# Patient Record
Sex: Male | Born: 1940 | Race: White | Hispanic: No
Health system: Southern US, Community
[De-identification: ages and names within clinical notes are randomized; demographics above are authoritative.]

---

## 2009-01-13 ENCOUNTER — Ambulatory Visit: Payer: Self-pay | Admitting: Specialist

## 2009-01-24 ENCOUNTER — Ambulatory Visit: Payer: Self-pay | Admitting: Radiation Oncology

## 2009-02-07 ENCOUNTER — Ambulatory Visit: Payer: Self-pay | Admitting: Radiation Oncology

## 2009-02-23 ENCOUNTER — Ambulatory Visit: Payer: Self-pay | Admitting: Radiation Oncology

## 2009-03-26 ENCOUNTER — Ambulatory Visit: Payer: Self-pay | Admitting: Radiation Oncology

## 2009-04-26 ENCOUNTER — Ambulatory Visit: Payer: Self-pay | Admitting: Radiation Oncology

## 2009-05-24 ENCOUNTER — Ambulatory Visit: Payer: Self-pay | Admitting: Radiation Oncology

## 2009-06-24 ENCOUNTER — Ambulatory Visit: Payer: Self-pay | Admitting: Radiation Oncology

## 2009-09-23 ENCOUNTER — Ambulatory Visit: Payer: Self-pay | Admitting: Radiation Oncology

## 2009-09-29 ENCOUNTER — Ambulatory Visit: Payer: Self-pay | Admitting: Radiation Oncology

## 2009-09-30 LAB — PSA

## 2009-10-24 ENCOUNTER — Ambulatory Visit: Payer: Self-pay | Admitting: Radiation Oncology

## 2010-04-24 ENCOUNTER — Ambulatory Visit: Payer: Self-pay | Admitting: Radiation Oncology

## 2010-04-25 LAB — PSA

## 2010-04-26 ENCOUNTER — Ambulatory Visit: Payer: Self-pay | Admitting: Radiation Oncology

## 2010-09-23 IMAGING — CT CT PELVIS W/ CM
1 series · 16 of 32 positions shown, 20 images · non-contrast
Comparison: none

REASON FOR EXAM: prostate CA
COMMENTS:

[Series 2: pelvis · axial · 0.70mm/px · z∈[+262,+467]mm · 16 of 46 slices shown, 20 images]
[im 3/46  soft-tissue]
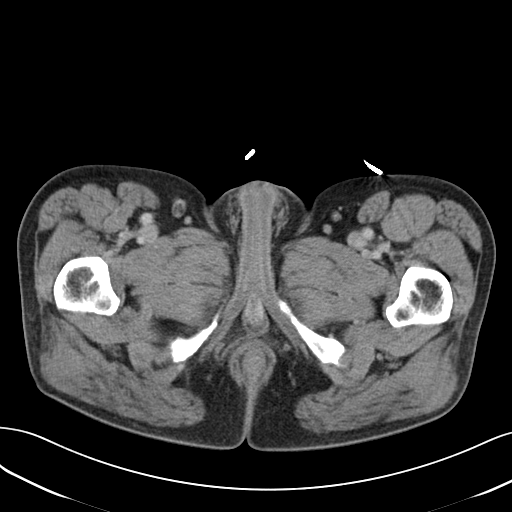
[im 3/46  bone]
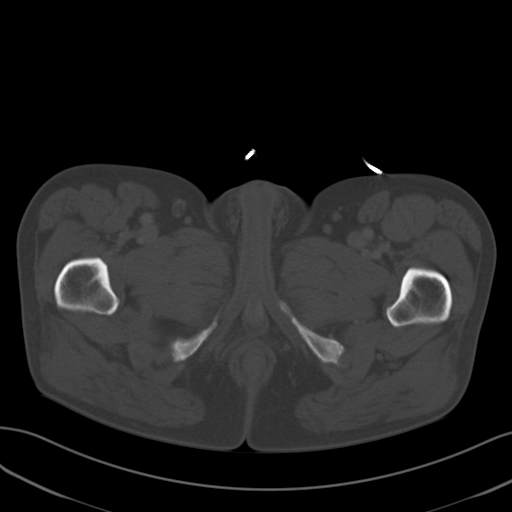
[im 6/46  soft-tissue]
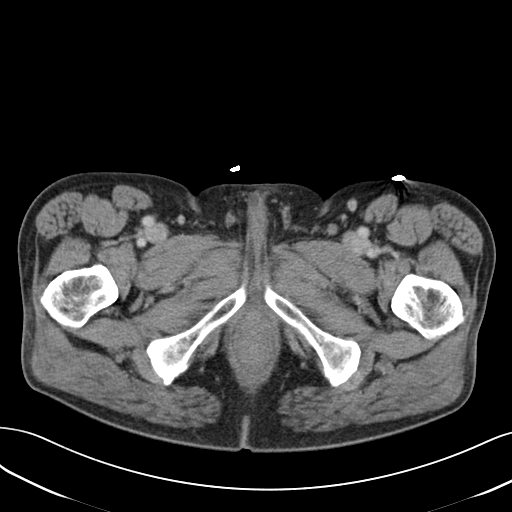
[im 9/46  soft-tissue]
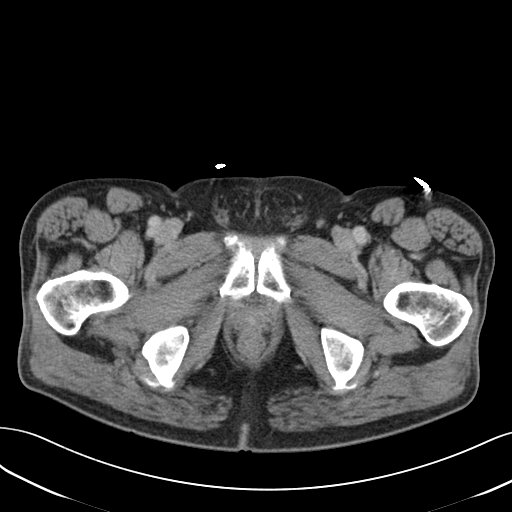
[im 12/46  soft-tissue]
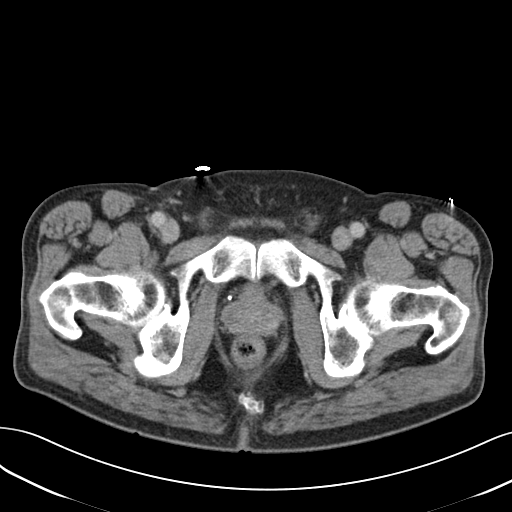
[im 15/46  soft-tissue]
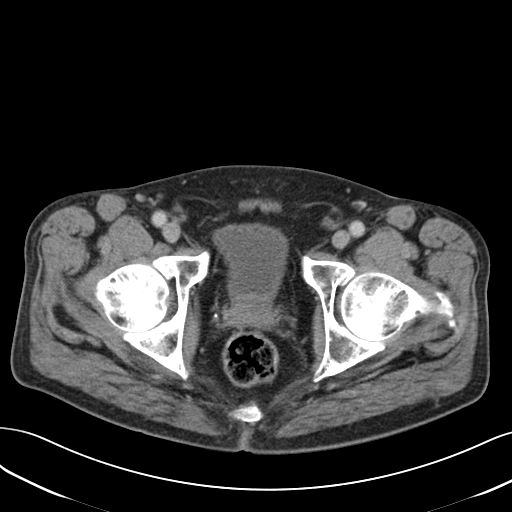
[im 18/46  soft-tissue]
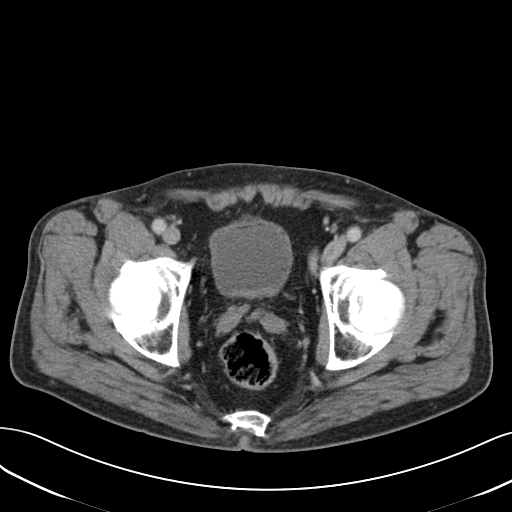
[im 21/46  soft-tissue]
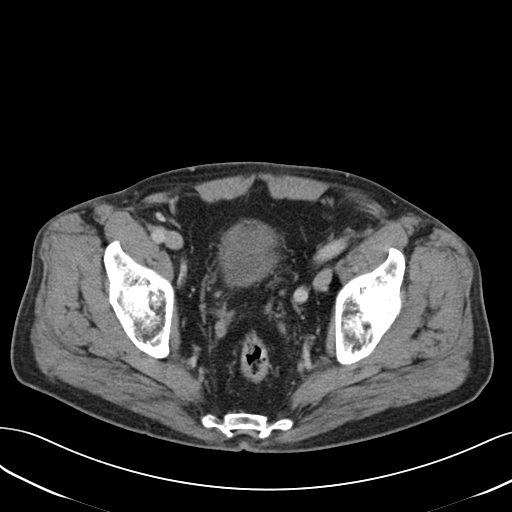
[im 25/46  soft-tissue]
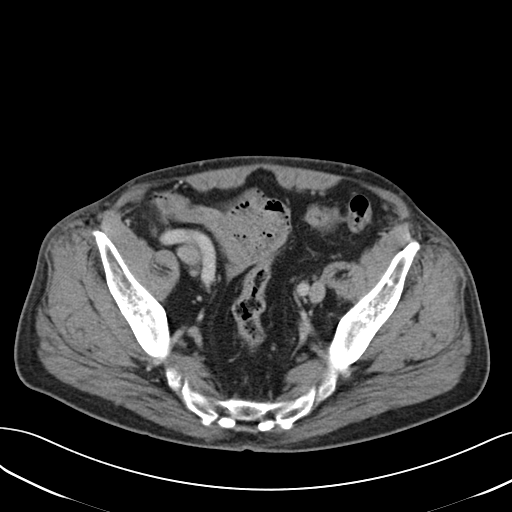
[im 28/46  soft-tissue]
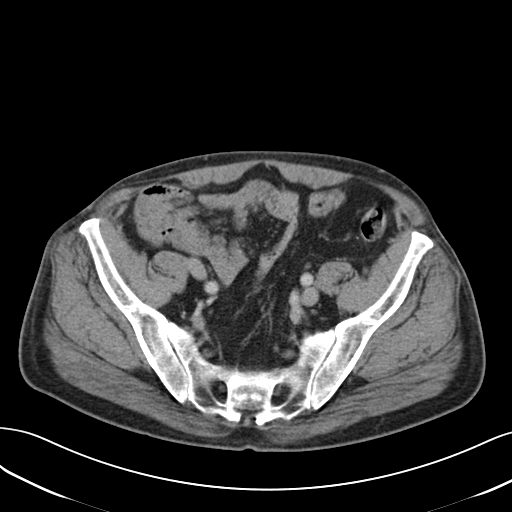
[im 28/46  bone]
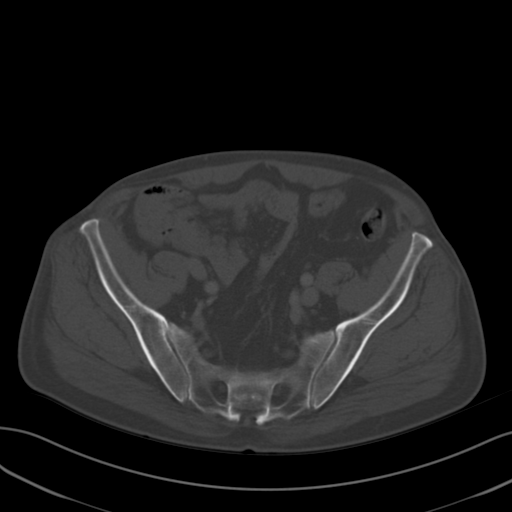
[im 31/46  soft-tissue]
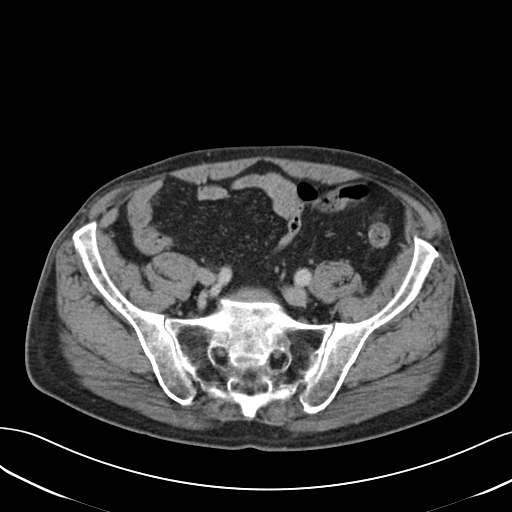
[im 34/46  soft-tissue]
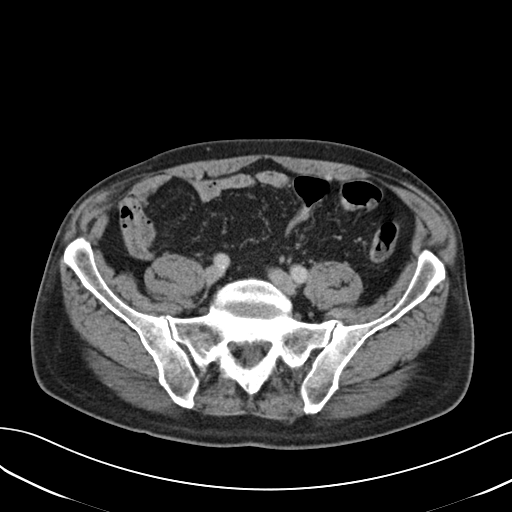
[im 37/46  soft-tissue]
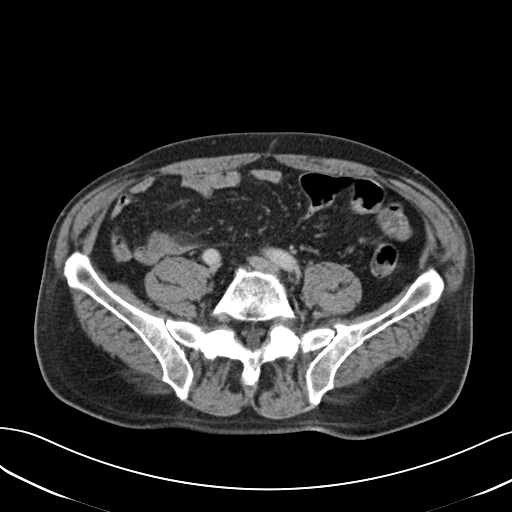
[im 40/46  soft-tissue]
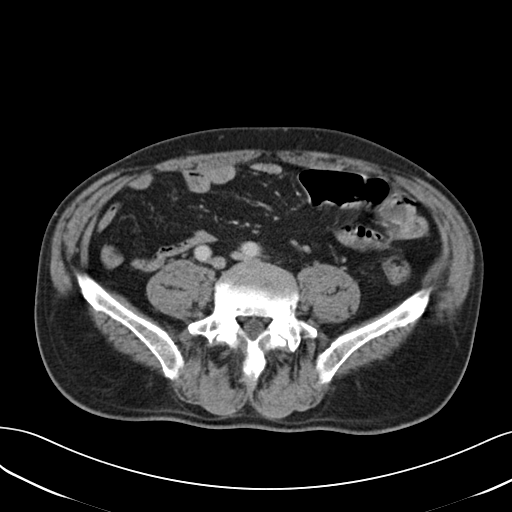
[im 40/46  lung]
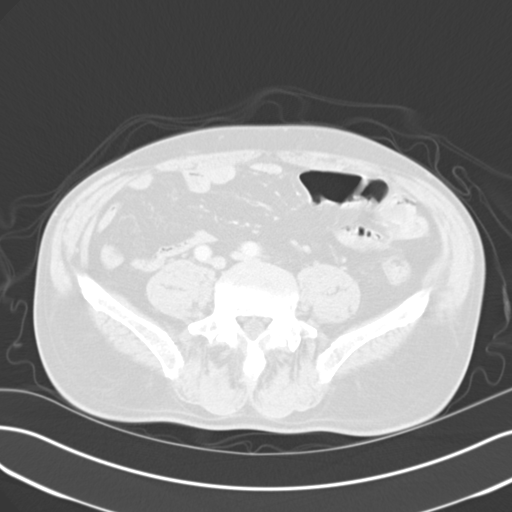
[im 41/46  lung]
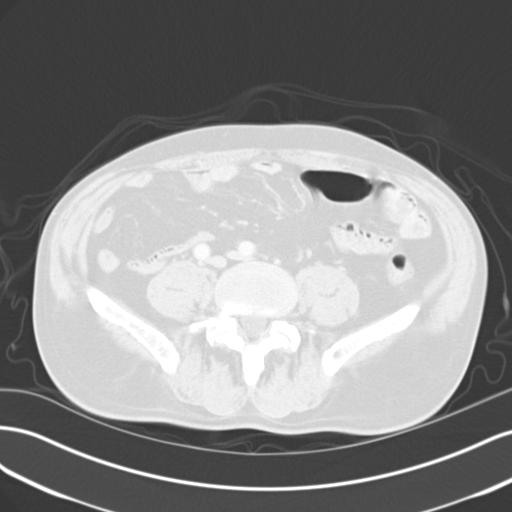
[im 43/46  soft-tissue]
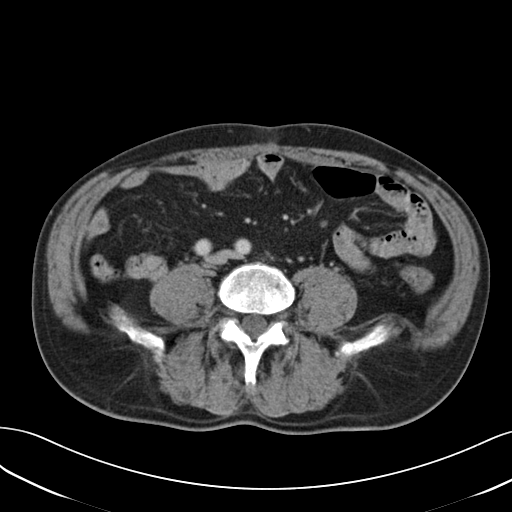
[im 43/46  lung]
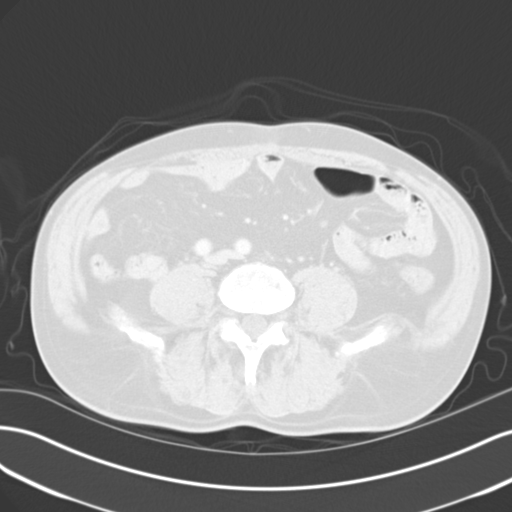
[im 44/46  lung]
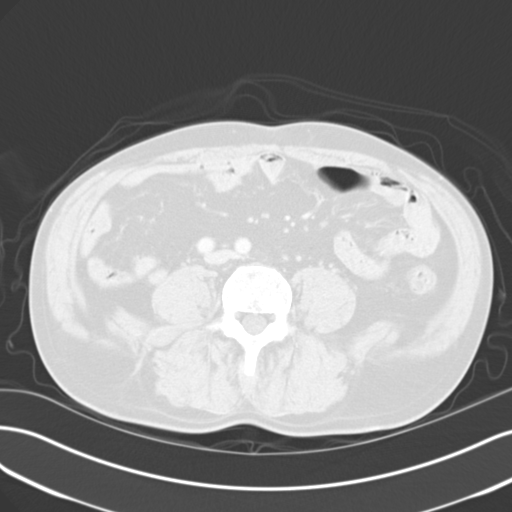

[16 of 32 positions shown; findings below may reference images not displayed]

PROCEDURE:     CT  - CT PELVIS STANDARD W  - January 13, 2009  [DATE]

RESULT:     Axial noncontrast CT scanning was performed through the abdomen
and pelvis at 5 mm intervals and slice thicknesses.

The prostate gland is normal in size contour. The seminal vesicles exhibit
no acute abnormality. The urinary bladder is only partially distended but is
grossly normal. There is no pelvic sidewall lymphadenopathy. The
rectosigmoid colon is normal in appearance. I do not see evidence of an
inguinal hernia.
IMPRESSION: Normal pelvic ultrasound examination.

## 2010-10-23 ENCOUNTER — Ambulatory Visit: Payer: Self-pay | Admitting: Radiation Oncology

## 2010-10-25 ENCOUNTER — Ambulatory Visit: Payer: Self-pay | Admitting: Radiation Oncology

## 2012-03-03 ENCOUNTER — Inpatient Hospital Stay: Payer: Self-pay | Admitting: Surgery

## 2012-03-03 LAB — PROTIME-INR
INR: 1
Prothrombin Time: 13.5 secs (ref 11.5–14.7)

## 2012-03-03 LAB — COMPREHENSIVE METABOLIC PANEL
Albumin: 3.4 g/dL (ref 3.4–5.0)
BUN: 14 mg/dL (ref 7–18)
Bilirubin,Total: 0.3 mg/dL (ref 0.2–1.0)
Chloride: 101 mmol/L (ref 98–107)
Co2: 23 mmol/L (ref 21–32)
Creatinine: 1.31 mg/dL — ABNORMAL HIGH (ref 0.60–1.30)
EGFR (African American): >60
EGFR (Non-African Amer.): 54 — ABNORMAL LOW
Glucose: 100 mg/dL — ABNORMAL HIGH (ref 65–99)
Osmolality: 269 (ref 275–301)
Potassium: 4.1 mmol/L (ref 3.5–5.1)
SGOT(AST): 37 U/L (ref 15–37)
SGPT (ALT): 22 U/L (ref 12–78)
Sodium: 134 mmol/L — ABNORMAL LOW (ref 136–145)
Total Protein: 7.2 g/dL (ref 6.4–8.2)

## 2012-03-03 LAB — CBC WITH DIFFERENTIAL/PLATELET
Basophil %: 1 %
Eosinophil %: 1.4 %
HCT: 17.8 % — ABNORMAL LOW (ref 40.0–52.0)
HGB: 5.6 g/dL — ABNORMAL LOW (ref 13.0–18.0)
Lymphocyte #: 1 10*3/uL (ref 1.0–3.6)
MCH: 22.2 pg — ABNORMAL LOW (ref 26.0–34.0)
MCV: 71 fL — ABNORMAL LOW (ref 80–100)
Monocyte #: 0.5 x10 3/mm (ref 0.2–1.0)
Monocyte %: 10.3 %
Neutrophil #: 3.4 10*3/uL (ref 1.4–6.5)
RBC: 2.52 10*6/uL — ABNORMAL LOW (ref 4.40–5.90)
WBC: 5 10*3/uL (ref 3.8–10.6)

## 2012-03-03 LAB — IRON AND TIBC
Iron Saturation: 3 %
Unbound Iron-Bind.Cap.: 534 ug/dL

## 2012-03-03 LAB — FERRITIN: Ferritin (ARMC): 9 ng/mL (ref 8–388)

## 2012-03-03 LAB — FOLATE: Folic Acid: 7.7 ng/mL (ref 3.1–100.0)

## 2012-03-03 LAB — TSH: Thyroid Stimulating Horm: 0.77 u[IU]/mL

## 2012-03-04 LAB — CBC WITH DIFFERENTIAL/PLATELET
Basophil #: 0 10*3/uL (ref 0.0–0.1)
Eosinophil %: 2.6 %
HCT: 25.3 % — ABNORMAL LOW (ref 40.0–52.0)
Lymphocyte #: 1.3 10*3/uL (ref 1.0–3.6)
Lymphocyte %: 19.9 %
MCV: 72 fL — ABNORMAL LOW (ref 80–100)
Monocyte #: 0.8 x10 3/mm (ref 0.2–1.0)
Monocyte %: 11.9 %
Neutrophil #: 4.3 10*3/uL (ref 1.4–6.5)
RBC: 3.5 10*6/uL — ABNORMAL LOW (ref 4.40–5.90)
RDW: 18.5 % — ABNORMAL HIGH (ref 11.5–14.5)
WBC: 6.6 10*3/uL (ref 3.8–10.6)

## 2012-03-04 LAB — BASIC METABOLIC PANEL
BUN: 18 mg/dL (ref 7–18)
Chloride: 97 mmol/L — ABNORMAL LOW (ref 98–107)
Co2: 29 mmol/L (ref 21–32)
Creatinine: 1.42 mg/dL — ABNORMAL HIGH (ref 0.60–1.30)
EGFR (African American): 57 — ABNORMAL LOW
EGFR (Non-African Amer.): 49 — ABNORMAL LOW
Glucose: 101 mg/dL — ABNORMAL HIGH (ref 65–99)
Osmolality: 270 (ref 275–301)
Sodium: 134 mmol/L — ABNORMAL LOW (ref 136–145)

## 2012-03-05 LAB — CBC WITH DIFFERENTIAL/PLATELET
Basophil %: 0.8 %
Eosinophil #: 0.4 10*3/uL (ref 0.0–0.7)
Eosinophil %: 6.3 %
HCT: 25.2 % — ABNORMAL LOW (ref 40.0–52.0)
HGB: 8.1 g/dL — ABNORMAL LOW (ref 13.0–18.0)
Lymphocyte %: 27 %
MCHC: 32.2 g/dL (ref 32.0–36.0)
Monocyte %: 11 %
Neutrophil #: 3.1 10*3/uL (ref 1.4–6.5)
Platelet: 197 10*3/uL (ref 150–440)
WBC: 5.7 10*3/uL (ref 3.8–10.6)

## 2012-03-05 LAB — BASIC METABOLIC PANEL
BUN: 17 mg/dL (ref 7–18)
Calcium, Total: 8.1 mg/dL — ABNORMAL LOW (ref 8.5–10.1)
Chloride: 100 mmol/L (ref 98–107)
Co2: 26 mmol/L (ref 21–32)
Osmolality: 275 (ref 275–301)
Potassium: 3.4 mmol/L — ABNORMAL LOW (ref 3.5–5.1)
Sodium: 137 mmol/L (ref 136–145)

## 2012-03-06 LAB — BASIC METABOLIC PANEL
Anion Gap: 9 (ref 7–16)
BUN: 13 mg/dL (ref 7–18)
Calcium, Total: 7.6 mg/dL — ABNORMAL LOW (ref 8.5–10.1)
Co2: 24 mmol/L (ref 21–32)
Creatinine: 1.24 mg/dL (ref 0.60–1.30)
EGFR (African American): >60
EGFR (Non-African Amer.): 58 — ABNORMAL LOW
Glucose: 96 mg/dL (ref 65–99)
Sodium: 137 mmol/L (ref 136–145)

## 2012-03-06 LAB — HEMOGLOBIN: HGB: 9.2 g/dL — ABNORMAL LOW (ref 13.0–18.0)

## 2012-03-06 LAB — CEA: CEA: 4.4 ng/mL (ref 0.0–4.7)

## 2012-03-07 LAB — CBC WITH DIFFERENTIAL/PLATELET
Basophil %: 0.8 %
Eosinophil %: 8.1 %
HCT: 28.6 % — ABNORMAL LOW (ref 40.0–52.0)
HGB: 9.2 g/dL — ABNORMAL LOW (ref 13.0–18.0)
Lymphocyte #: 1.2 10*3/uL (ref 1.0–3.6)
MCH: 24.4 pg — ABNORMAL LOW (ref 26.0–34.0)
MCHC: 32.2 g/dL (ref 32.0–36.0)
MCV: 76 fL — ABNORMAL LOW (ref 80–100)
Monocyte #: 0.5 x10 3/mm (ref 0.2–1.0)
Monocyte %: 7.8 %
Platelet: 165 10*3/uL (ref 150–440)
RBC: 3.77 10*6/uL — ABNORMAL LOW (ref 4.40–5.90)

## 2012-03-07 LAB — BASIC METABOLIC PANEL
Anion Gap: 6 — ABNORMAL LOW (ref 7–16)
BUN: 11 mg/dL (ref 7–18)
Chloride: 107 mmol/L (ref 98–107)
Creatinine: 1.24 mg/dL (ref 0.60–1.30)
EGFR (African American): >60
EGFR (Non-African Amer.): 58 — ABNORMAL LOW

## 2012-04-04 ENCOUNTER — Ambulatory Visit: Payer: Self-pay | Admitting: Cardiology

## 2012-04-17 ENCOUNTER — Ambulatory Visit: Payer: Self-pay | Admitting: Surgery

## 2012-04-17 LAB — CBC WITH DIFFERENTIAL/PLATELET
Basophil #: 0 10*3/uL (ref 0.0–0.1)
Basophil %: 0.6 %
Eosinophil %: 3.6 %
HCT: 27.7 % — ABNORMAL LOW (ref 40.0–52.0)
Lymphocyte %: 18.6 %
MCH: 25.6 pg — ABNORMAL LOW (ref 26.0–34.0)
MCHC: 32.5 g/dL (ref 32.0–36.0)
MCV: 79 fL — ABNORMAL LOW (ref 80–100)
Monocyte #: 0.7 x10 3/mm (ref 0.2–1.0)
Monocyte %: 11.1 %
Neutrophil %: 66.1 %
RBC: 3.51 10*6/uL — ABNORMAL LOW (ref 4.40–5.90)

## 2012-04-17 LAB — BASIC METABOLIC PANEL
Anion Gap: 9 (ref 7–16)
BUN: 15 mg/dL (ref 7–18)
Calcium, Total: 9.3 mg/dL (ref 8.5–10.1)
Chloride: 96 mmol/L — ABNORMAL LOW (ref 98–107)
Co2: 28 mmol/L (ref 21–32)
EGFR (African American): 53 — ABNORMAL LOW
EGFR (Non-African Amer.): 46 — ABNORMAL LOW
Glucose: 100 mg/dL — ABNORMAL HIGH (ref 65–99)
Osmolality: 267 (ref 275–301)
Sodium: 133 mmol/L — ABNORMAL LOW (ref 136–145)

## 2012-04-17 LAB — URINALYSIS, COMPLETE
Bacteria: NONE SEEN
Bilirubin,UR: NEGATIVE
Blood: NEGATIVE
Glucose,UR: NEGATIVE mg/dL (ref 0–75)
Nitrite: NEGATIVE
Ph: 7 (ref 4.5–8.0)
Squamous Epithelial: NONE SEEN
WBC UR: <1 /HPF (ref 0–5)

## 2012-04-17 LAB — PROTIME-INR
INR: 1
Prothrombin Time: 13.1 secs (ref 11.5–14.7)

## 2012-04-28 ENCOUNTER — Inpatient Hospital Stay: Payer: Self-pay | Admitting: Surgery

## 2012-04-29 LAB — CBC WITH DIFFERENTIAL/PLATELET
Basophil #: 0 10*3/uL (ref 0.0–0.1)
Basophil #: 0 10*3/uL (ref 0.0–0.1)
Basophil %: 0.4 %
Eosinophil #: 0 10*3/uL (ref 0.0–0.7)
Eosinophil #: 0 10*3/uL (ref 0.0–0.7)
Eosinophil %: 0.1 %
Eosinophil %: 0.2 %
HGB: 7.3 g/dL — ABNORMAL LOW (ref 13.0–18.0)
Lymphocyte #: 0.9 10*3/uL — ABNORMAL LOW (ref 1.0–3.6)
Lymphocyte #: 0.8 10*3/uL — ABNORMAL LOW (ref 1.0–3.6)
Lymphocyte %: 10.4 %
Lymphocyte %: 9.9 %
MCH: 25.3 pg — ABNORMAL LOW (ref 26.0–34.0)
MCH: 25.5 pg — ABNORMAL LOW (ref 26.0–34.0)
MCHC: 31 g/dL — ABNORMAL LOW (ref 32.0–36.0)
MCV: 79 fL — ABNORMAL LOW (ref 80–100)
Monocyte #: 1.2 x10 3/mm — ABNORMAL HIGH (ref 0.2–1.0)
Monocyte %: 10.7 %
Neutrophil %: 76.7 %
Neutrophil %: 77.9 %
Platelet: 208 10*3/uL (ref 150–440)
Platelet: 204 10*3/uL (ref 150–440)
RBC: 3.01 10*6/uL — ABNORMAL LOW (ref 4.40–5.90)
RBC: 2.86 10*6/uL — ABNORMAL LOW (ref 4.40–5.90)
RBC: 2.88 10*6/uL — ABNORMAL LOW (ref 4.40–5.90)
RDW: 21.8 % — ABNORMAL HIGH (ref 11.5–14.5)
RDW: 21.6 % — ABNORMAL HIGH (ref 11.5–14.5)
RDW: 21.8 % — ABNORMAL HIGH (ref 11.5–14.5)
WBC: 10.5 10*3/uL (ref 3.8–10.6)
WBC: 9.3 10*3/uL (ref 3.8–10.6)

## 2012-04-29 LAB — BASIC METABOLIC PANEL
Anion Gap: 9 (ref 7–16)
Calcium, Total: 8.5 mg/dL (ref 8.5–10.1)
Creatinine: 1.85 mg/dL — ABNORMAL HIGH (ref 0.60–1.30)
EGFR (African American): 41 — ABNORMAL LOW
EGFR (Non-African Amer.): 36 — ABNORMAL LOW
Glucose: 132 mg/dL — ABNORMAL HIGH (ref 65–99)
Osmolality: 276 (ref 275–301)
Potassium: 4.2 mmol/L (ref 3.5–5.1)
Sodium: 136 mmol/L (ref 136–145)

## 2012-04-30 LAB — CBC WITH DIFFERENTIAL/PLATELET
Basophil #: 0 10*3/uL (ref 0.0–0.1)
Eosinophil #: 0 10*3/uL (ref 0.0–0.7)
HCT: 21.9 % — ABNORMAL LOW (ref 40.0–52.0)
Lymphocyte %: 11 %
MCH: 24.9 pg — ABNORMAL LOW (ref 26.0–34.0)
MCHC: 31.2 g/dL — ABNORMAL LOW (ref 32.0–36.0)
MCV: 80 fL (ref 80–100)
Monocyte #: 1.1 x10 3/mm — ABNORMAL HIGH (ref 0.2–1.0)
Monocyte %: 12.3 %
Platelet: 189 10*3/uL (ref 150–440)
RDW: 21.6 % — ABNORMAL HIGH (ref 11.5–14.5)
WBC: 9.3 10*3/uL (ref 3.8–10.6)

## 2012-05-01 LAB — BASIC METABOLIC PANEL
Anion Gap: 6 — ABNORMAL LOW (ref 7–16)
BUN: 9 mg/dL (ref 7–18)
Calcium, Total: 8.2 mg/dL — ABNORMAL LOW (ref 8.5–10.1)
Co2: 27 mmol/L (ref 21–32)
EGFR (Non-African Amer.): >60
Glucose: 128 mg/dL — ABNORMAL HIGH (ref 65–99)
Potassium: 3.8 mmol/L (ref 3.5–5.1)
Sodium: 137 mmol/L (ref 136–145)

## 2012-05-01 LAB — HEMOGLOBIN: HGB: 8 g/dL — ABNORMAL LOW (ref 13.0–18.0)

## 2012-05-03 LAB — CBC WITH DIFFERENTIAL/PLATELET
Basophil #: 0 10*3/uL (ref 0.0–0.1)
Eosinophil #: 0.2 10*3/uL (ref 0.0–0.7)
HCT: 25.3 % — ABNORMAL LOW (ref 40.0–52.0)
HGB: 8.5 g/dL — ABNORMAL LOW (ref 13.0–18.0)
Lymphocyte #: 0.9 10*3/uL — ABNORMAL LOW (ref 1.0–3.6)
Lymphocyte %: 15.6 %
MCH: 26.8 pg (ref 26.0–34.0)
MCHC: 33.5 g/dL (ref 32.0–36.0)
Monocyte #: 0.7 x10 3/mm (ref 0.2–1.0)
Monocyte %: 11.7 %
Neutrophil #: 3.8 10*3/uL (ref 1.4–6.5)
Neutrophil %: 67.8 %
Platelet: 216 10*3/uL (ref 150–440)
RBC: 3.17 10*6/uL — ABNORMAL LOW (ref 4.40–5.90)
RDW: 21.6 % — ABNORMAL HIGH (ref 11.5–14.5)
WBC: 5.6 10*3/uL (ref 3.8–10.6)

## 2012-05-03 LAB — BASIC METABOLIC PANEL
Anion Gap: 8 (ref 7–16)
Calcium, Total: 8.8 mg/dL (ref 8.5–10.1)
Chloride: 104 mmol/L (ref 98–107)
Co2: 26 mmol/L (ref 21–32)
Osmolality: 275 (ref 275–301)
Sodium: 138 mmol/L (ref 136–145)

## 2012-05-05 LAB — PATHOLOGY REPORT

## 2012-06-04 ENCOUNTER — Other Ambulatory Visit: Payer: Self-pay | Admitting: Surgery

## 2012-06-04 LAB — URINALYSIS, COMPLETE
Bilirubin,UR: NEGATIVE
Glucose,UR: NEGATIVE mg/dL (ref 0–75)
Nitrite: NEGATIVE
RBC,UR: <1 /HPF (ref 0–5)
Specific Gravity: 1.005 (ref 1.003–1.030)
Squamous Epithelial: <1
WBC UR: 3 /HPF (ref 0–5)

## 2012-06-06 LAB — URINE CULTURE

## 2012-07-09 ENCOUNTER — Inpatient Hospital Stay: Payer: Self-pay | Admitting: Internal Medicine

## 2012-07-09 LAB — TROPONIN I: Troponin-I: <0.02 ng/mL

## 2012-07-09 LAB — COMPREHENSIVE METABOLIC PANEL
Albumin: 3.7 g/dL (ref 3.4–5.0)
Alkaline Phosphatase: 113 U/L (ref 50–136)
Bilirubin,Total: 0.6 mg/dL (ref 0.2–1.0)
Calcium, Total: 9.2 mg/dL (ref 8.5–10.1)
Creatinine: 1.73 mg/dL — ABNORMAL HIGH (ref 0.60–1.30)
EGFR (African American): 45 — ABNORMAL LOW
EGFR (Non-African Amer.): 39 — ABNORMAL LOW
SGOT(AST): 51 U/L — ABNORMAL HIGH (ref 15–37)
SGPT (ALT): 31 U/L (ref 12–78)
Sodium: 135 mmol/L — ABNORMAL LOW (ref 136–145)

## 2012-07-09 LAB — CBC
HCT: 30 % — ABNORMAL LOW (ref 40.0–52.0)
HGB: 9.4 g/dL — ABNORMAL LOW (ref 13.0–18.0)
MCH: 24.7 pg — ABNORMAL LOW (ref 26.0–34.0)
MCHC: 31.2 g/dL — ABNORMAL LOW (ref 32.0–36.0)
RBC: 3.8 10*6/uL — ABNORMAL LOW (ref 4.40–5.90)
WBC: 4.9 10*3/uL (ref 3.8–10.6)

## 2012-07-09 LAB — ETHANOL: Ethanol: <3 mg/dL

## 2012-07-09 LAB — CK TOTAL AND CKMB (NOT AT ARMC): CK-MB: 2.9 ng/mL (ref 0.5–3.6)

## 2012-07-10 LAB — BASIC METABOLIC PANEL
Anion Gap: 6 — ABNORMAL LOW (ref 7–16)
BUN: 22 mg/dL — ABNORMAL HIGH (ref 7–18)
Calcium, Total: 8.2 mg/dL — ABNORMAL LOW (ref 8.5–10.1)
Chloride: 106 mmol/L (ref 98–107)
Co2: 27 mmol/L (ref 21–32)
Creatinine: 1.25 mg/dL (ref 0.60–1.30)
EGFR (African American): >60
Glucose: 100 mg/dL — ABNORMAL HIGH (ref 65–99)
Sodium: 139 mmol/L (ref 136–145)

## 2012-07-10 LAB — CBC WITH DIFFERENTIAL/PLATELET
Basophil #: 0 10*3/uL (ref 0.0–0.1)
Basophil %: 0.4 %
HCT: 24.4 % — ABNORMAL LOW (ref 40.0–52.0)
HGB: 7.6 g/dL — ABNORMAL LOW (ref 13.0–18.0)
MCH: 24.5 pg — ABNORMAL LOW (ref 26.0–34.0)
MCHC: 31.1 g/dL — ABNORMAL LOW (ref 32.0–36.0)
Monocyte #: 0.6 x10 3/mm (ref 0.2–1.0)
Monocyte %: 13.6 %
Neutrophil #: 2.3 10*3/uL (ref 1.4–6.5)
Platelet: 105 10*3/uL — ABNORMAL LOW (ref 150–440)
WBC: 4.2 10*3/uL (ref 3.8–10.6)

## 2012-07-11 LAB — CBC WITH DIFFERENTIAL/PLATELET
Basophil #: 0 10*3/uL (ref 0.0–0.1)
Basophil %: 0.6 %
Eosinophil %: 7 %
HCT: 27.6 % — ABNORMAL LOW (ref 40.0–52.0)
HGB: 8.9 g/dL — ABNORMAL LOW (ref 13.0–18.0)
Lymphocyte #: 1 10*3/uL (ref 1.0–3.6)
MCHC: 32.2 g/dL (ref 32.0–36.0)
Monocyte #: 0.6 x10 3/mm (ref 0.2–1.0)
Monocyte %: 13.6 %
Neutrophil #: 2.7 10*3/uL (ref 1.4–6.5)
RBC: 3.51 10*6/uL — ABNORMAL LOW (ref 4.40–5.90)
WBC: 4.6 10*3/uL (ref 3.8–10.6)

## 2012-07-15 LAB — BASIC METABOLIC PANEL
BUN: 22 mg/dL — ABNORMAL HIGH (ref 7–18)
Chloride: 107 mmol/L (ref 98–107)
Co2: 27 mmol/L (ref 21–32)
Creatinine: 1.11 mg/dL (ref 0.60–1.30)
EGFR (African American): >60
Glucose: 97 mg/dL (ref 65–99)
Osmolality: 283 (ref 275–301)
Potassium: 3.4 mmol/L — ABNORMAL LOW (ref 3.5–5.1)
Sodium: 140 mmol/L (ref 136–145)

## 2012-10-07 ENCOUNTER — Emergency Department: Payer: Self-pay | Admitting: Emergency Medicine

## 2012-10-07 LAB — CBC
HCT: 38.2 % — ABNORMAL LOW (ref 40.0–52.0)
HGB: 12.3 g/dL — ABNORMAL LOW (ref 13.0–18.0)
MCHC: 32.3 g/dL (ref 32.0–36.0)
MCV: 84 fL (ref 80–100)
Platelet: 129 10*3/uL — ABNORMAL LOW (ref 150–440)
RBC: 4.54 10*6/uL (ref 4.40–5.90)
RDW: 17.7 % — ABNORMAL HIGH (ref 11.5–14.5)
WBC: 7.6 10*3/uL (ref 3.8–10.6)

## 2012-10-07 LAB — COMPREHENSIVE METABOLIC PANEL
Alkaline Phosphatase: 122 U/L (ref 50–136)
Anion Gap: 8 (ref 7–16)
Calcium, Total: 9.4 mg/dL (ref 8.5–10.1)
Chloride: 93 mmol/L — ABNORMAL LOW (ref 98–107)
EGFR (African American): 52 — ABNORMAL LOW
Glucose: 139 mg/dL — ABNORMAL HIGH (ref 65–99)
Osmolality: 266 (ref 275–301)
Potassium: 3.6 mmol/L (ref 3.5–5.1)
SGOT(AST): 77 U/L — ABNORMAL HIGH (ref 15–37)
SGPT (ALT): 49 U/L (ref 12–78)
Sodium: 132 mmol/L — ABNORMAL LOW (ref 136–145)
Total Protein: 8.6 g/dL — ABNORMAL HIGH (ref 6.4–8.2)

## 2012-10-07 LAB — URINALYSIS, COMPLETE
Bilirubin,UR: NEGATIVE
Glucose,UR: >=500 mg/dL (ref 0–75)
Leukocyte Esterase: NEGATIVE
Nitrite: NEGATIVE
RBC,UR: 1 /HPF (ref 0–5)
Specific Gravity: 1.01 (ref 1.003–1.030)

## 2012-10-07 LAB — CK TOTAL AND CKMB (NOT AT ARMC): CK, Total: 392 U/L — ABNORMAL HIGH (ref 35–232)

## 2012-10-07 LAB — TROPONIN I: Troponin-I: <0.02 ng/mL

## 2012-12-31 ENCOUNTER — Emergency Department: Payer: Self-pay

## 2012-12-31 LAB — COMPREHENSIVE METABOLIC PANEL
Albumin: 3.9 g/dL (ref 3.4–5.0)
BUN: 12 mg/dL (ref 7–18)
Calcium, Total: 9 mg/dL (ref 8.5–10.1)
Chloride: 95 mmol/L — ABNORMAL LOW (ref 98–107)
Creatinine: 1.27 mg/dL (ref 0.60–1.30)
EGFR (African American): >60
Osmolality: 264 (ref 275–301)
SGOT(AST): 38 U/L — ABNORMAL HIGH (ref 15–37)
Total Protein: 8 g/dL (ref 6.4–8.2)

## 2012-12-31 LAB — CBC
HCT: 38.2 % — ABNORMAL LOW (ref 40.0–52.0)
HGB: 13 g/dL (ref 13.0–18.0)
MCH: 29.8 pg (ref 26.0–34.0)
MCHC: 33.9 g/dL (ref 32.0–36.0)
RBC: 4.35 10*6/uL — ABNORMAL LOW (ref 4.40–5.90)
WBC: 7.2 10*3/uL (ref 3.8–10.6)

## 2012-12-31 LAB — TSH: Thyroid Stimulating Horm: 0.831 u[IU]/mL

## 2012-12-31 LAB — CK TOTAL AND CKMB (NOT AT ARMC): CK, Total: 108 U/L (ref 35–232)

## 2013-01-01 LAB — TROPONIN I: Troponin-I: <0.02 ng/mL

## 2013-01-02 ENCOUNTER — Emergency Department: Payer: Self-pay | Admitting: Emergency Medicine

## 2013-01-02 LAB — URINALYSIS, COMPLETE
Bacteria: NONE SEEN
Glucose,UR: 150 mg/dL (ref 0–75)
Hyaline Cast: 5
Leukocyte Esterase: NEGATIVE
Ph: 5 (ref 4.5–8.0)
Specific Gravity: 1.013 (ref 1.003–1.030)
Squamous Epithelial: <1

## 2013-01-02 LAB — CBC
HCT: 38 % — ABNORMAL LOW (ref 40.0–52.0)
MCH: 29.9 pg (ref 26.0–34.0)
MCHC: 34 g/dL (ref 32.0–36.0)
Platelet: 189 10*3/uL (ref 150–440)
RDW: 16.2 % — ABNORMAL HIGH (ref 11.5–14.5)
WBC: 6.4 10*3/uL (ref 3.8–10.6)

## 2013-01-02 LAB — COMPREHENSIVE METABOLIC PANEL
Albumin: 3.9 g/dL (ref 3.4–5.0)
Alkaline Phosphatase: 110 U/L (ref 50–136)
Anion Gap: 6 — ABNORMAL LOW (ref 7–16)
BUN: 13 mg/dL (ref 7–18)
Bilirubin,Total: 0.6 mg/dL (ref 0.2–1.0)
Co2: 29 mmol/L (ref 21–32)
Osmolality: 266 (ref 275–301)
Potassium: 3.6 mmol/L (ref 3.5–5.1)
SGOT(AST): 32 U/L (ref 15–37)
Total Protein: 7.9 g/dL (ref 6.4–8.2)

## 2013-01-02 LAB — TROPONIN I
Troponin-I: <0.02 ng/mL
Troponin-I: <0.02 ng/mL

## 2013-05-13 ENCOUNTER — Ambulatory Visit: Payer: Self-pay | Admitting: Surgery

## 2013-06-01 ENCOUNTER — Ambulatory Visit: Payer: Self-pay | Admitting: Gastroenterology

## 2014-06-17 IMAGING — CT CT HEAD WITHOUT CONTRAST
1 series · 16 of 30 positions shown, 20 images · non-contrast
Comparison: none

REASON FOR EXAM: dizziness
COMMENTS:

PROCEDURE:     CT  - CT HEAD WITHOUT CONTRAST  - October 07, 2012  [DATE]
RESULT:     Comparison:  None
TECHNIQUE: Multiple axial images from the foramen magnum to the vertex were
obtained without IV contrast.

[Series 2: soft tissue · axial · 0.41mm/px · z∈[-88,+47]mm · 16 of 31 slices shown, 20 images]
[im 2/31  brain]
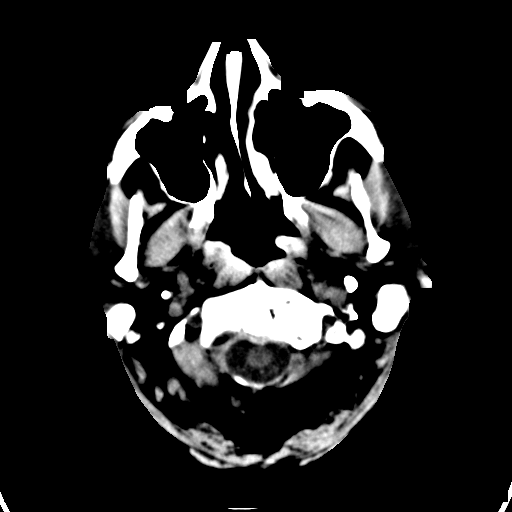
[im 2/31  bone]
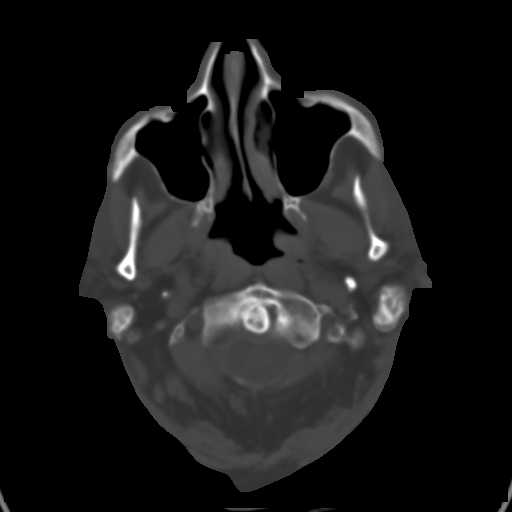
[im 4/31  brain]
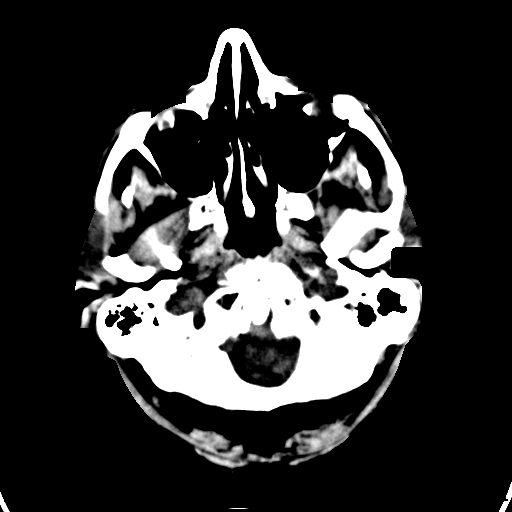
[im 6/31  brain]
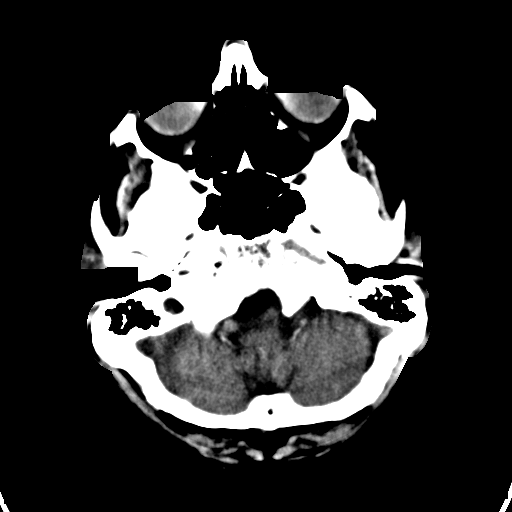
[im 8/31  brain]
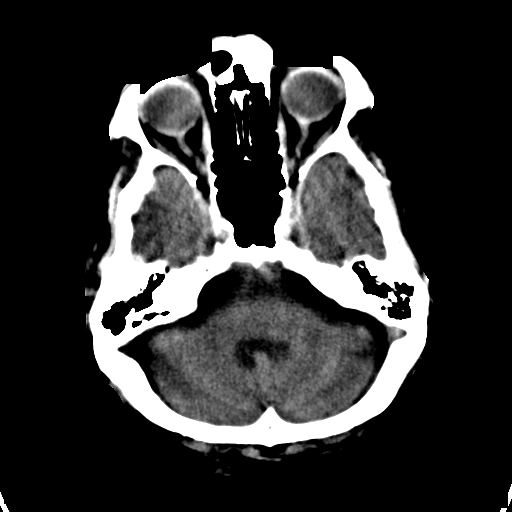
[im 9/31  brain]
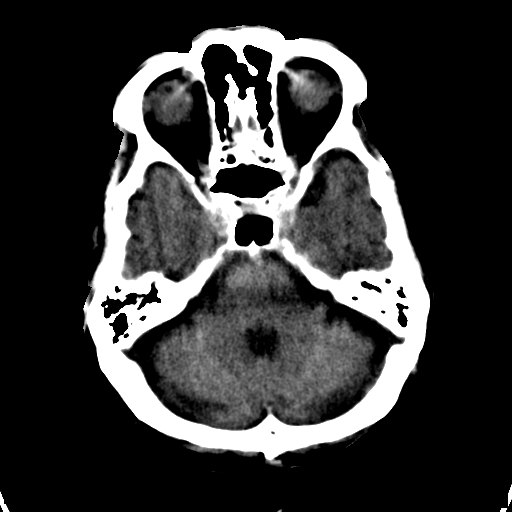
[im 9/31  bone]
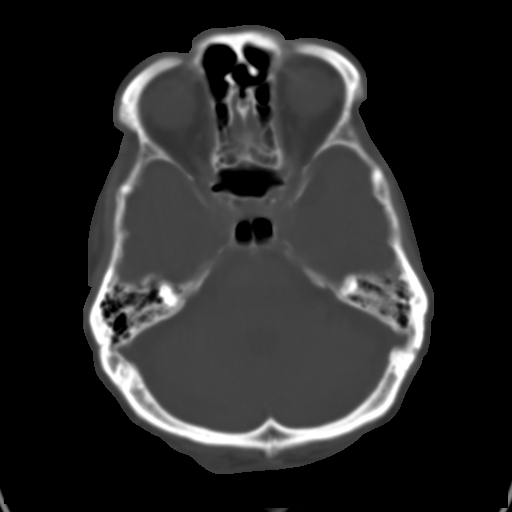
[im 11/31  brain]
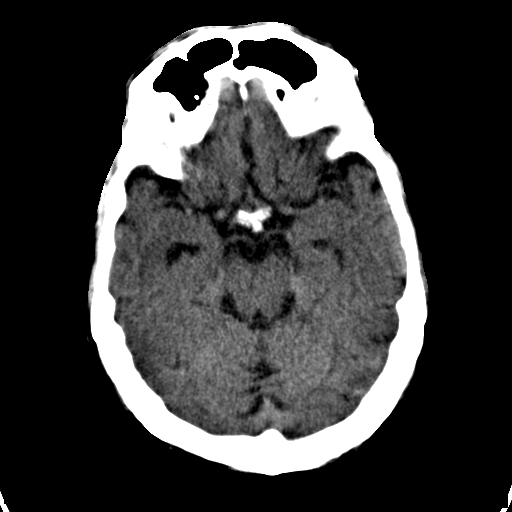
[im 13/31  brain]
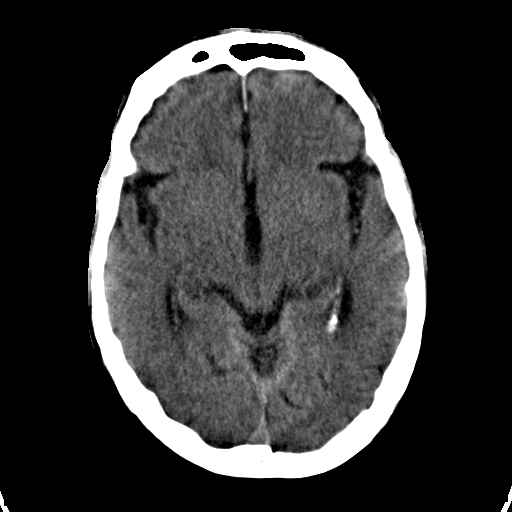
[im 15/31  brain]
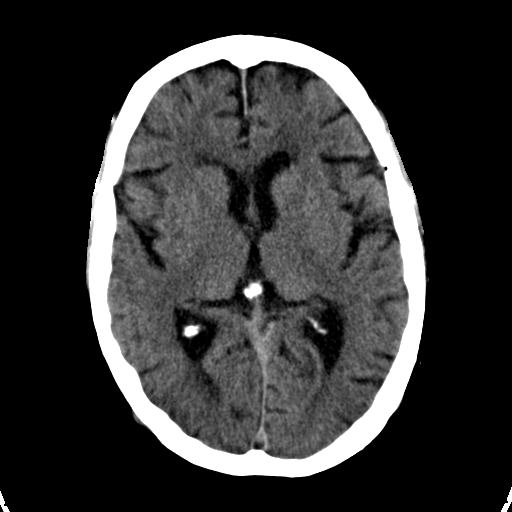
[im 16/31  brain]
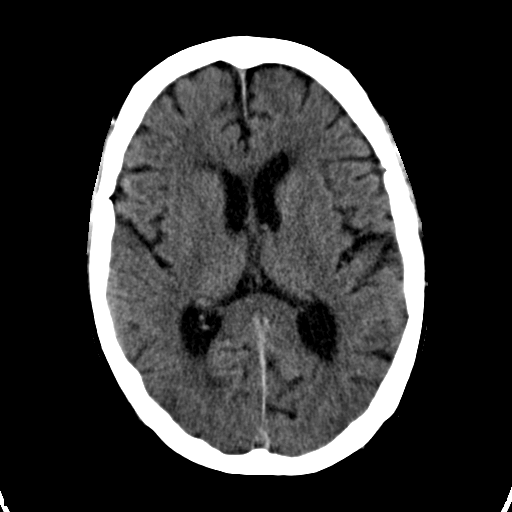
[im 16/31  bone]
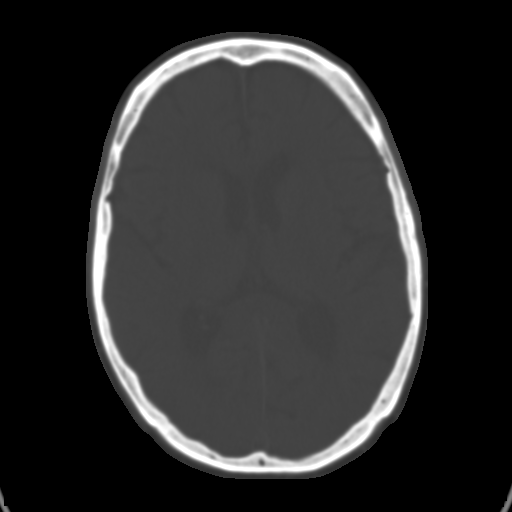
[im 18/31  brain]
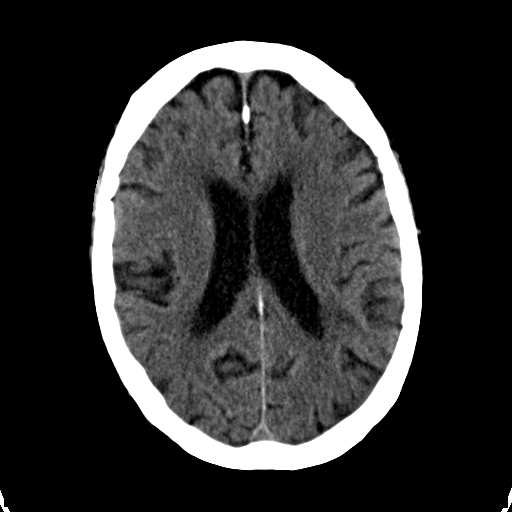
[im 20/31  brain]
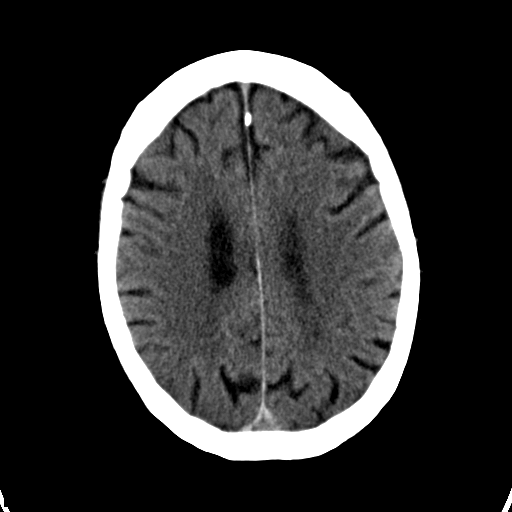
[im 22/31  brain]
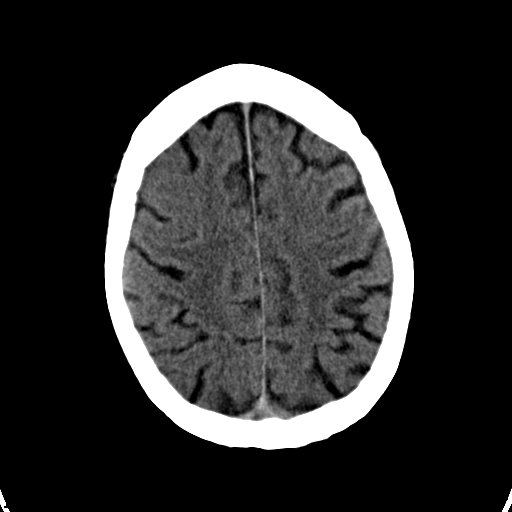
[im 23/31  brain]
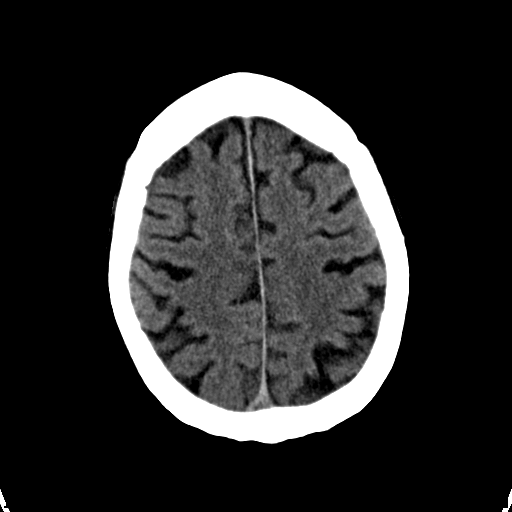
[im 23/31  bone]
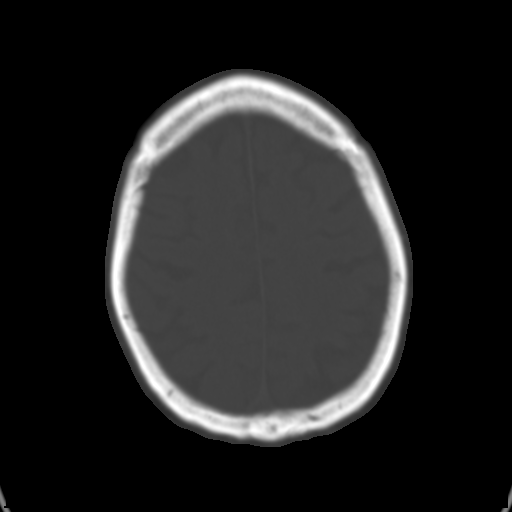
[im 25/31  brain]
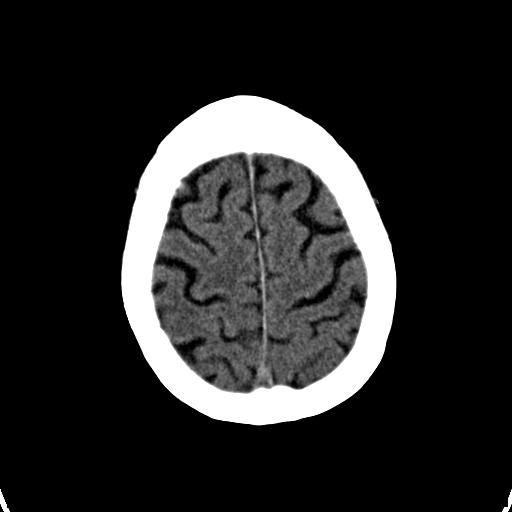
[im 27/31  brain]
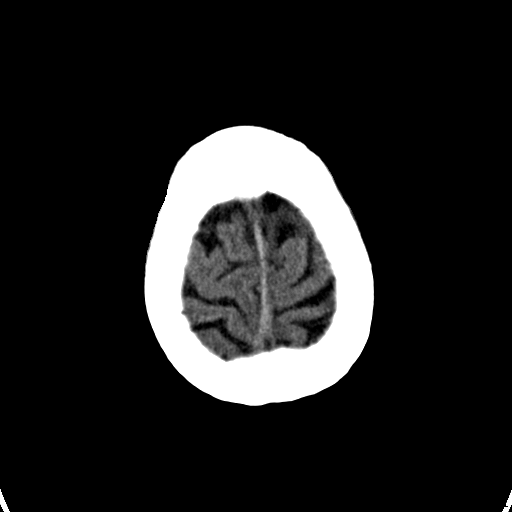
[im 29/31  brain]
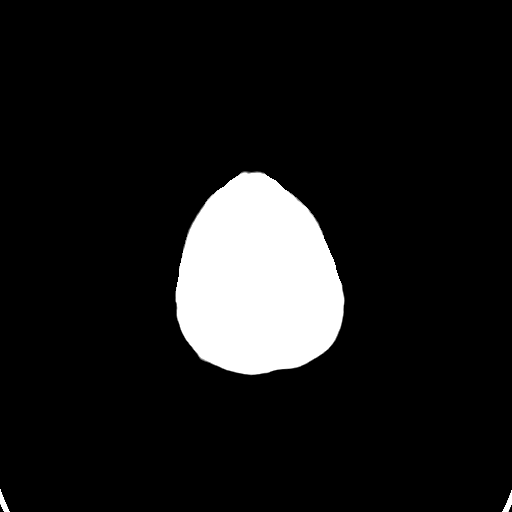

[16 of 30 positions shown; findings below may reference images not displayed]

FINDINGS: There is no evidence of mass effect, midline shift, or extra-axial fluid
collections.  There is no evidence of a space-occupying lesion or
intracranial hemorrhage. There is no evidence of a cortical-based area of
acute infarction. There is generalized cerebral atrophy. There is
periventricular white matter low attenuation likely secondary to
microangiopathy.

The ventricles and sulci are appropriate for the patient's age. The basal
cisterns are patent.

Visualized portions of the orbits are unremarkable. The visualized portions
of the paranasal sinuses and mastoid air cells are unremarkable.

The osseous structures are unremarkable.
IMPRESSION: No acute intracranial process.

[REDACTED]

## 2014-07-13 NOTE — Consult Note (Signed)
Chief Complaint:   Subjective/Chief Complaint Severe anemia secondary to colonic mass. Biospies pending as well as CEA. Surgical consult requested. Will repeat Net B tomorrow and if better can proceed with a CT scan of abdomen and pelvis. Will follow.   Electronic Signatures: Lurline DelIftikhar, Alexus Galka (MD)  (Signed 11-Dec-13 17:54)  Authored: Chief Complaint   Last Updated: 11-Dec-13 17:54 by Lurline DelIftikhar, Luvada Salamone (MD)

## 2014-07-13 NOTE — Consult Note (Signed)
PATIENT NAME:  Kenneth Guerrero, Kenneth Guerrero MR#:  [id number] DATE OF BIRTH:  December 23, 1940  DATE OF CONSULTATION:  03/05/2012  REFERRING PHYSICIAN:  Dr. Lady GaryFath  CONSULTING PHYSICIAN:  Lurline DelShaukat Miloh Alcocer, MD  REASON FOR CONSULTATION: Severe anemia.   HISTORY OF PRESENT ILLNESS: This is a 74 year old male with history of prostate cancer status post radiation. The patient was evaluated recently by Dr. Lady GaryFath due to increasing shortness of breath, fatigue, and chest tightness. A functional study revealed mild reversible inferior ischemia. The patient was brought back for work-up prior to a cardiac cath. Hemoglobin came back at 5.6 and the patient was admitted to the hospital for blood transfusion and further work-up. The patient denies any significant GI symptoms although he is giving history of small amount of bright red blood per rectum with bowel movements for quite some time. He is also complaining of some dyspepsia and feeling of fullness in his chest after meals. No other significant symptoms were reported by the patient. About two years ago his hemoglobin was normal.   PAST MEDICAL HISTORY: Significant for hypertension.   ALLERGIES: None.   SOCIAL HISTORY: Unremarkable.   FAMILY HISTORY: Unremarkable.   REVIEW OF SYSTEMS: Grossly negative except for what is mentioned in the history of present illness.   PHYSICAL EXAMINATION:   GENERAL: Fairly well built male who does not appear to be in any acute distress.   VITAL SIGNS: His vitals are fairly stable. Heart rate is in the 60's and 70's, temperature 98.3, respirations 18, blood pressure 150/80. Clinically he does appear to be anemic.   HEENT: Otherwise, unremarkable.   LUNGS: Clear to auscultation bilaterally with fair entry.   CARDIOVASCULAR: Regular rate and rhythm. Normal S1, S2. No murmurs.   ABDOMEN: Fairly benign abdomen. No hepatosplenomegaly or ascites. Bowel sounds positive. Nontender, nondistended.   NEUROLOGIC: Appears to be unremarkable.    LABORATORY, DIAGNOSTIC, AND RADIOLOGICAL DATA: Hemoglobin on admission 5.6, hematocrit 17.8, platelet count 227, MCV 71. After 2 units of packed RBC transfusion his hemoglobin is now 8.1. Electrolytes are fairly unremarkable. Liver enzymes are normal. Iron saturation is 3%.   ASSESSMENT AND PLAN: This is a patient with severe microcytic iron deficiency anemia. This is very likely to be secondary to chronic GI blood loss. The patient has never had an upper GI endoscopy or colonoscopy. The patient is complaining of some epigastric fullness and dyspepsia raising concerns about possible upper GI pathology such as GERD, peptic ulcer disease, gastritis, or even gastric or esophageal malignancy. Lower GI lesion such as colon polyps and malignancies are also likely due to his age and the fact that he has never had a colonoscopy before. The patient is currently chest pain free and his symptoms of fatigue, shortness of breath, and chest pain are more likely secondary to demand ischemia and fluid overload secondary to chronic anemia. I would proceed with further GI work-up including an upper GI endoscopy as well as a colonoscopy followed by further testing as necessary. The procedures of upper GI endoscopy as well as colonoscopy were discussed with him in detail along with their potential complications and he is in full agreement. Further recommendations to follow.   ____________________________ Lurline DelShaukat Rameen Gohlke, MD si:drc D: 03/05/2012 08:31:27 ET T: 03/05/2012 09:43:07 ET JOB#: [id number]  cc: Lurline DelShaukat Kasara Schomer, MD, <Dictator> Darlin PriestlyKenneth A. Lady GaryFath, MD Lurline DelSHAUKAT Lucian Baswell MD ELECTRONICALLY SIGNED 03/12/2012 11:37

## 2014-07-13 NOTE — Consult Note (Signed)
Brief Consult Note: Diagnosis: Severe anemia.   Patient was seen by consultant.   Orders entered.   Comments: Severe iron deficiency anemia with h/o intermittent rectal bleeding as well as indigestion.  Recommendations: EGD and colonosscopy in am. Further recommendations to follow.  Electronic Signatures: Lurline DelIftikhar, Johathan Province (MD)  (Signed 10-Dec-13 17:30)  Authored: Brief Consult Note   Last Updated: 10-Dec-13 17:30 by Lurline DelIftikhar, Meagan Ancona (MD)

## 2014-07-13 NOTE — Consult Note (Signed)
Chief Complaint:   Subjective/Chief Complaint No active bleeding. H and H better. Agree with CT scan as mentioned by Dr. Juliann PulseLundquist followed be surgery when appropriate. Clear liquids for now although if no surgery is planned this week, the diet can be advanced. Further recommendations per general surgery. Will see him as OP in few weeks. Will sign off. Please call Dr. Bluford Kaufmannh if needed as I will be out of town for next few days. Thanks.   Electronic Signatures: Lurline DelIftikhar, Kloee Ballew (MD)  (Signed 12-Dec-13 12:07)  Authored: Chief Complaint   Last Updated: 12-Dec-13 12:07 by Lurline DelIftikhar, Amarian Botero (MD)

## 2014-07-13 NOTE — Consult Note (Signed)
PATIENT NAME:  Kenneth Guerrero, Kenneth Guerrero MR#:  [id number] DATE OF BIRTH:  05-Jun-1940  DATE OF CONSULTATION:  03/06/2012  REFERRING PHYSICIAN:   CONSULTING PHYSICIAN:  Kenneth Deerhristopher A. Merlinda Wrubel, MD  INDICATION FOR CONSULTATION: Anemia.   HISTORY OF PRESENT ILLNESS: Mr. Kenneth Guerrero is a pleasant 74 year old male with history of prostate cancer status post radiation approximately two years ago who presented to Cardiology office with shortness of breath, chest pain, and fatigue for the last couple of months. He was being considered for cardiac cath. His hemoglobin was 5.6. Otherwise, he said other than the fatigue and the chest pain he had been doing fine, had been eating well, and hadn't had any weight loss. No fevers, chills, shortness of breath, cough, nausea, vomiting, diarrhea, constipation, dysuria, or hematuria. He had never had a colonoscopy prior and was transfused and underwent work-up for anemia. He had an upper GI endoscopy and a colonoscopy which showed a fungating mass at his proximal transverse colon which was two thirds circumferential which was concerning for cancer. It was biopsied and pathology is still pending. In addition, he had an upper GI which showed a narrowing Schatzki's ring and a hiatal hernia, normal stomach and duodenum. He responded adequately to transfusion. We are being consulted for possible colon cancer.   PAST MEDICAL HISTORY:  1. Prostate cancer, status post radiation.  2. Hypertension, for which he is on medications.   ALLERGIES: No known drug allergies.   SOCIAL HISTORY: Denies tobacco. Drinks he says one beer a day but can go without drinking. No other drugs. [place] [address] with his brother who also had prostate cancer.   FAMILY HISTORY: Had a brother with prostate cancer, underwent surgery. Both parents are deceased, unknown as far as their medical history.   REVIEW OF SYSTEMS: Pertinent positives and negatives as presented above in history of present illness.  Total 12 point review of systems was obtained.    PHYSICAL EXAMINATION:    VITAL SIGNS: Temperature 97.9, pulse 76, blood pressure 128/75, respirations 18, 95% on room air.   GENERAL: No acute distress, alert and oriented x3.   HEAD: Normocephalic, atraumatic.   EYES: No scleral icterus. No conjunctivitis.   FACE: No obvious facial trauma.   ENT: Normal external ears. Normal external nose.   NECK: No obvious masses.   CHEST: Lungs clear to auscultation, moving air well.   HEART: Regular rate and rhythm. No murmurs, rubs, or gallops.   ABDOMEN: Soft, nontender, nondistended.   EXTREMITIES: Moves all extremities well.   LABORATORY, DIAGNOSTIC, AND RADIOLOGICAL DATA: Currently hemoglobin 9.2, hematocrit 28.9, proper response to 3 units of blood. Creatinine 1.24. Potassium 3.5. CT scan is pending.   ASSESSMENT AND PLAN: Mr. Kenneth Guerrero is a pleasant 74 year old male with history of partially obstructing mass. There is no need for emergent surgery, however, it has bled in the past. Would like to wait for pathology. Would also recommend when creatinine is normal CT scan of chest, abdomen, and pelvis for staging. Would recommend sending CEA as well, which has actually already been sent. Will discuss possible surgical intervention. The patient does have current cardiac disease. Again, would have to weigh the benefits of undergoing further work-up with Cardiology disease as the patient would have to be on antiplatelet medicine which would potentially contribute to bleeding.    We will continue to follow.   ____________________________ Kenneth Raiderhristopher A. Klara Stjames, MD cal:drc D: 03/06/2012 09:12:14 ET T: 03/06/2012 09:32:47 ET JOB#: [id number]  cc: Kenneth Deerhristopher A. Kyomi Hector, MD, <Dictator> Kenneth Guerrero  Kenneth Bjork MD ELECTRONICALLY SIGNED 03/07/2012 10:37

## 2014-07-16 NOTE — Discharge Summary (Signed)
PATIENT NAME:  Kenneth Guerrero, Vahe E MR#:  [id number] DATE OF BIRTH:  02/04/1941  DATE OF ADMISSION:  04/28/2012 DATE OF DISCHARGE:  05/02/2012  DISCHARGE DIAGNOSES: 1. Proximal transverse colon cancer. Final pathology pending. Status post laparoscopic assisted extended right hemicolectomy.  2. History of prostate cancer status post radiation therapy.  3. History of cholecystectomy.  4. History of appendectomy.  5. History of reversal ischemia on stress test with negative cardiac catheterization. 6. Hypertension.   DISCHARGE MEDICATIONS: 1. Atenolol/chlorthalidone 10/25 mg p.o. daily.  2. Norco 1 to 2 tabs p.o. q. 4 hours p.r.n. pain.   HISTORY OF PRESENT ILLNESS: Mr. Kenneth Guerrero is a pleasant 74 year old male who presented with chest pain back in December. He was noted to have a reversible ischemia and was later found to be anemic. He had a colonoscopy which showed almost partially obstructing right proximal transverse colon mass which was biopsied as adenocarcinoma. He thus was admitted for extended right hemicolectomy.   HOSPITAL COURSE: Mr. Kenneth Guerrero underwent laparoscopic assisted extended right hemicolectomy.   He postoperatively was noted to be mildly anemic and then over the next day was noted to drop his hemoglobin at 6.8. He was transfused a unit of blood and responded appropriately. Throughout hospital course, he was begun on n.p.o. and advanced to clear liquids and regular diet. He began passing flatus and his pain medicine was transitioned from PCA to oral Norco. He was able to participate with PT/OT, but it was felt that his environment was not safe for him, therefore, he was admitted to rehabilitation facility.      DISCHARGE INSTRUCTIONS: Mr. Kenneth Guerrero is to return in 1 week for followup with me. He is to call or return to the ED if he develops fever greater 101.5, nausea, vomiting, redness or drainage from the incision. His pathology is pending.   ____________________________ Si Raiderhristopher A. Geramy Lamorte, MD cal:sb D: 05/02/2012 12:40:59 ET T: 05/02/2012 12:54:46 ET JOB#: [id number]  cc: Cristal Deerhristopher A. Oneida Mckamey, MD, <Dictator> Jarvis NewcomerHRISTOPHER A Tayron Hunnell MD ELECTRONICALLY SIGNED 05/04/2012 10:02

## 2014-07-16 NOTE — Discharge Summary (Signed)
PATIENT NAME:  Kenneth Guerrero, Kenneth Guerrero MR#:  [id number] DATE OF BIRTH:  [birth year]  DATE OF ADMISSION:  03/03/2012 DATE OF DISCHARGE:  03/08/2012  DISCHARGE DIAGNOSES:  1.  Reversible ischemia and coronary artery disease on nuclear medicine stress test.  2.  Anemia secondary to chronic blood loss.  3.  Proximal transverse colon adenocarcinoma.  4.  History of prostate cancer, status post radiation.  5.  Hypertension.   DISCHARGE MEDICATIONS:  As follows:  Atenolol:chlorthalidone,100/ 25 mg 1 tab p.o. daily.   REASON FOR ADMISSION:  The patient is a pleasant [age] year old male who presented to Dr. Lady GaryFath with shortness of breath and dyspnea on exertion, who had a stress test as an outpatient and was noted to have reversible ischemia. He was brought in for possible cardiac catheterization and intervention as needed. He was noted to be anemic with his hemoglobin of 5.6 and, thus, was investigated for the etiology of severe anemia. He had a colonoscopy on March 05, 2012, which showed a large fungating mass of his proximal transverse colon which was tattooed and biopsied. The biopsy later came back as adenocarcinoma.  I was consulted for evaluation of this patient and CT was performed after his creatinine was optimized, which showed no obvious chest or other lesions which would indicate metastasis.  He was transfused when found to be anemic and his hemoglobin and hematocrit was stable at the time of discharge. He was thus discharged to home in satisfactory condition with plans to follow up with cardiology for a probable catheterization and non-drug-eluting stent placement if possible, and subsequent colon resection.   DISCHARGE INSTRUCTIONS: The patient is to call or follow up with me in approximately 1 to 2 weeks for discussion of management options. He is to follow up with Dr. Lady GaryFath for catheterization. I have communicated with Dr. Lady GaryFath the plan to have him catheterized, likely get a non-drug-eluting stent and  have his colon cancer resected at a later date when it would not be dangerous for him to be off antiplatelet medications.  If he was to bleed on the antiplatelet medications, we will operate on him sooner. He is to call if he develops increased pain, bloody or dark stools and his shortness of breath returns.     ____________________________ Si Raiderhristopher A. Ashleah Valtierra, MD cal:cs D: 03/25/2012 19:52:21 ET T: 03/25/2012 20:44:21 ET JOB#: [id number]  cc: Cristal Deerhristopher A. Derrik Mceachern, MD, <Dictator> Jarvis NewcomerHRISTOPHER A Jonell Krontz MD ELECTRONICALLY SIGNED 03/26/2012 14:20

## 2014-07-16 NOTE — Discharge Summary (Signed)
PATIENT NAME:  Kenneth Guerrero, [name] MR#:  [id number] DATE OF BIRTH:  17-Feb-1941  DATE OF ADMISSION:  07/09/2012 DATE OF DISCHARGE:  07/11/2012  ADMITTING DIAGNOSES: Dizziness and weakness.   DISCHARGE DIAGNOSES:   1.  Dizziness and weakness due to dehydration as well as worsening anemia, now dizziness, resolved.  2.  Acute renal failure again felt to be dehydration, resolved.  3.  Significant electrolyte imbalances, with hypokalemia and magnesium. He is status post replacement.  4.  Hypertension that has been stable.  5.  Symptomatic anemia with dizziness, status post transfusion with one packed RBCs.  6.  History of colon cancer, status post extensive right hemicolectomy. The patient has a follow-up appointment with surgery.   PERTINENT LABORATORY AND DIAGNOSTIC DATA:  EKG shows sinus tachycardia with occasional PVCs. Alcohol level was less than 0.003. Troponin less than 0.02, magnesium was 1.1, WBC 4.9, hemoglobin 9.4, platelet count 133, BUN was 23, creatinine 1.73, sodium 135, potassium 3.3, chloride 98, CO2 28. LFTs showed AST of 51, otherwise LFTs were normal. Chest x-ray shows no acute cardiopulmonary processes. CT scan of the head without contrast showed atrophic and chronic microvascular ischemic disease. Most recent hemoglobin today is 8.9.  His potassium today is 3.4.   ASSESSMENT AND PLAN: The patient is a 74 year old white male with history of colon cancer, status post resection who presented with complaint of dizziness and weakness. The patient also was noted to be in acute renal failure on presentation. He also appeared to be volume depleted. It was felt that his symptoms are due to dehydration and he was given IV fluids. He was also noticed to have anemia, which is a chronic problem for him. He does have a history of colon cancer. He was transfused. He does have a low MCV so he was started on iron supplement and he is doing much better. He needs continued outpatient follow-up on his  hemoglobin. He may need hematology evaluation. For his colon cancer, he needs to follow up with surgery. They need to decide if he needs to have further radiation or other treatment. At this time he is stable for discharge.   DISCHARGE MEDICATIONS: Amlodipine 5 daily, magnesium oxide 401 tabs p.o. b.i.d., iron sulfate 180 mg daily, Colace 100, 1 tab p.o. b.i.d. as needed for constipation.  DIET:  Consistency regular, low sodium.   ACTIVITY: As tolerated.   FOLLOW UP:  Follow-up with primary M.D. in 1 to 2 weeks. Follow up with surgery as previously scheduled.  Primary physician needs to follow his CBC and electrolytes at time of visit and needs to have a routine check on his blood work her on to prevent this from happening again.   TIME SPENT: 45 minutes spent on the discharge.     ____________________________ Lacie ScottsShreyang H. Allena KatzPatel, MD shp:ct D: 07/11/2012 14:57:42 ET T: 07/12/2012 08:43:22 ET JOB#: [id number]  cc: Javontay Vandam H. Allena KatzPatel, MD, <Dictator> Mickie HillierJack H. Sheppard PentonWolf, MD Charise CarwinSHREYANG H Fannye Myer MD ELECTRONICALLY SIGNED 07/20/2012 13:04

## 2014-07-16 NOTE — H&P (Signed)
PATIENT NAME:  Kenneth Guerrero, Kenneth Guerrero MR#:  [id number] DATE OF BIRTH:  1940/09/14  DATE OF ADMISSION:  07/09/2012  PRIMARY CARE PHYSICIAN: Mickie HillierJack H. Sheppard PentonWolf, MD  CHIEF COMPLAINT: Dizziness and weakness.   HISTORY OF PRESENT ILLNESS: This is a 74 year old male who presents to the hospital with dizziness and weakness that has been ongoing for the past 3 days. The patient says that the dizziness and weakness is intermittent in nature. It comes and goes, but for the past day or so, it has been persistent throughout the day. He says that when he gets it and tries to ambulate, he feels increasingly dizzy and has to sit down. He denies any tendinitis. He denies any numbness or tingling, any headache, blurry vision, chest pain, shortness of breath, nausea, vomiting or any other associated symptoms. The patient says that he was recently taken off one of his blood pressure meds and switched over to a new one. Since then, he has been feeling increasingly dizzy.   REVIEW OF SYSTEMS:  CONSTITUTIONAL: No documented fever. No weight gain, no weight loss.  EYES: No blurry or double vision.  ENT: No tinnitus. No postnasal drip. No redness of the oropharynx.  RESPIRATORY: No cough, no wheeze, no hemoptysis, no dyspnea.  CARDIOVASCULAR: No chest pain, no orthopnea, no palpitations, no syncope.  GASTROINTESTINAL: No nausea, no vomiting, no diarrhea, no abdominal pain, no melena or hematochezia.  GENITOURINARY: No dysuria, no hematuria.  ENDOCRINE: No polyuria, no nocturia. No heat or cold intolerance.  HEMATOLOGIC: No anemia, no bruising, no bleeding.  INTEGUMENTARY: No rashes. No lesions.  MUSCULOSKELETAL: No arthritis, no swelling, no gout.  NEUROLOGIC: No numbness, no tingling. No ataxia. No seizure-type activity. Positive dizziness.  PSYCHIATRIC: No anxiety. No insomnia. No ADD.    PAST MEDICAL HISTORY: Consistent with:  1. Hypertension.  2. History of colon cancer status post extensive right hemicolectomy. 3.  Chronic anemia secondary to colon cancer.   SOCIAL HISTORY: No smoking. Occasional alcohol use. No illicit drug abuse. Lives at home with one of his friends.   FAMILY HISTORY: The patient's father died from a brain aneurysm. Mother died from a malignancy in the brain.   CURRENT MEDICATIONS: Norvasc 5 mg daily.   PHYSICAL EXAMINATION ON ADMISSION: As follows:  VITAL SIGNS: Noted to be temperature 98, pulse 116, respirations 20, blood pressure 174/81, sat is 99% on room air.  GENERAL: He is a pleasant-appearing male in no apparent distress.  HEAD, EYES, EARS, NOSE AND THROAT: Atraumatic, normocephalic. Extraocular muscles are intact. Pupils equal and reactive to light. Sclerae anicteric. No conjunctival injection. No pharyngeal erythema.  NECK: Supple. There is no jugular venous distention, no bruits, no lymphadenopathy, no thyromegaly.  HEART: Regular rate and rhythm, tachycardic. No murmurs, no rubs, no clicks.  LUNGS: Clear to auscultation bilaterally. No rales, no rhonchi, no wheezes.  ABDOMEN: Soft, flat, nontender, nondistended. Has good bowel sounds. No hepatosplenomegaly appreciated.  EXTREMITIES: No evidence of any cyanosis, clubbing or peripheral edema. Has +2 pedal and radial pulses bilaterally.  NEUROLOGICAL: The patient is alert, awake and oriented x3 with no focal motor or sensory deficits appreciated bilaterally.  SKIN: Moist and warm, with no rashes appreciated.  LYMPHATIC: There is no cervical or axillary lymphadenopathy.   LABORATORY EXAMINATION: Showed a serum glucose of 136, BUN 23, creatinine 1.7, sodium 135, potassium 3.3, chloride 98, bicarbonate 28, magnesium is 1.1. LFTs are within normal limits. Troponin less than 0.02. White cell count 4.9, hemoglobin 9.4, hematocrit 30, platelet count of 133,  MCV of 79.   IMAGING: The patient did have a CT of the head done which showed no evidence of any acute intracranial abnormality, but chronic microvascular ischemic disease.    ASSESSMENT AND PLAN: This is a 74 year old male with a history of hypertension, a history of colon cancer status post hemicolectomy, chronic anemia, who presents to the hospital with dizziness, weakness and noted to be in acute renal failure.   1. Dizziness/weakness. The exact etiology of this is unclear, but likely suspected to be secondary to dehydration as the patient was noted to be in acute renal failure and hypokalemic. I will hydrate the patient with IV fluids, follow him clinically. CT head is negative; therefore, no evidence of any acute neurogenic source presently and no clinical evidence of any vertigo. Will check orthostatics.  2. Acute renal failure. This is likely secondary to dehydration. I will hydrate him with IV fluids, follow BUN and creatinine and urine output. Renal dose meds. Avoid nephrotoxins.  3. Hypokalemia and hypomagnesemia likely secondary to dehydration. Will supplement his potassium and magnesium and repeat them in the morning.  4. Hypertension. Continue with his Norvasc for now.   CODE STATUS: The patient is a full code.   TIME SPENT ON ADMISSION: 50 minutes.   ____________________________ Rolly Pancake. Cherlynn Kaiser, MD vjs:OSi D: 07/09/2012 15:41:52 ET T: 07/09/2012 15:52:05 ET JOB#: [id number]  cc: Rolly Pancake. Cherlynn Kaiser, MD, <Dictator> Houston Siren MD ELECTRONICALLY SIGNED 07/10/2012 21:40

## 2014-07-16 NOTE — Op Note (Signed)
PATIENT NAME:  Kenneth Guerrero, Kenneth Guerrero MR#:  [id number] DATE OF BIRTH:  25-Apr-1940  DATE OF PROCEDURE:  04/28/2012  ATTENDING PHYSICIAN: Ida Roguehristopher Ellerie Arenz, MD.  ASSISTANT: Dr. Natale LayMark Bird and Tressia Minersarolyn York, [place] student.  ANESTHESIA: General.  PREOPERATIVE DIAGNOSIS: Right-sided colon cancer.   POSTOPERATIVE DIAGNOSIS: Proximal transverse colon, colon cancer.   PROCEDURE PERFORMED: Laparoscopic assisted extended right hemicolectomy.   ESTIMATED BLOOD LOSS: 250 mL.   SPECIMEN: Colon.  INDICATION FOR SURGERY: The patient is a pleasant 74 year old male with history of a right colon cancer, which was biopsied. He was brought to the operating room suite for a laparoscopic assisted  right hemicolectomy.   DETAILS OF PROCEDURE: Informed consent was obtained for procedure. The patient was brought to the operating room suite. He was induced, endotracheal tube was placed and general anesthesia was administered. A supraumbilical incision was made. This was deepened down to the fascia. The fascia was incised. The peritoneum was entered. 2-0 Vicryl stay sutures were placed through the fascia. Hassan trocar was placed in the abdomen. The abdomen was insufflated. The tattooed mass was seen to be in the proximal transverse colon. I then evaluated the entire abdomen and found no other indication for metastatic disease. The left lower quadrant and midline subxiphoid trocars were placed. The colon was then mobilized using Harmonic ultrasound from the terminal ileum to the transverse colon and when it was nice and mobile, a supraumbilical incision was made. The colon was then brought out through the laparotomy. The bowel was then transected just left of the middle colic so that a 5 cm distal margin was made and then at the terminal ileum using a GIA 75 stapler, the mesentery was then high ligated using Kelly clamps and 3-0 silk ties. The ureter was visualized during laparoscopic mobilization and was found to be down.  The colon was then taken out through the abdomen. The abdomen was irrigated. Hemostasis occurred. A side-to-side functional end-to-end anastomosis was made between the terminal ileum and the transverse colon using GIA 75 stapler. The mesentery was not closed. The anastomosis was then evaluated and was noted to be hemostatic. Draw back to the abdomen, the abdominal incision was closed using a single 0 looped PDS which was tied at the end. The skin was then stapled. The previous trocar sites were then stapled. A sterile dressing was then placed over the wound. The patient was then awoken, extubated, and brought to the postanesthesia care unit. There were no immediate complications. Needle, sponge, and instrument counts were correct at the end of the procedure.   ____________________________ Si Raiderhristopher A. Gail Creekmore, MD cal:aw D: 04/29/2012 08:29:18 ET T: 04/29/2012 11:39:28 ET JOB#: [id number]  cc: Cristal Deerhristopher A. Audreyanna Butkiewicz, MD, <Dictator> Jarvis NewcomerHRISTOPHER A Endya Austin MD ELECTRONICALLY SIGNED 05/04/2012 10:02

## 2015-07-25 DEATH — deceased
